# Patient Record
Sex: Female | Born: 1995
Health system: Southern US, Community
[De-identification: ages and names within clinical notes are randomized; demographics above are authoritative.]

## PROBLEM LIST (undated history)

## (undated) DIAGNOSIS — J302 Other seasonal allergic rhinitis: Secondary | ICD-10-CM

---
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
## 2015-01-24 ENCOUNTER — Ambulatory Visit (INDEPENDENT_AMBULATORY_CARE_PROVIDER_SITE_OTHER): Payer: 59

## 2015-01-24 DIAGNOSIS — Z23 Encounter for immunization: Secondary | ICD-10-CM | POA: Diagnosis not present

## 2015-04-14 MED FILL — HYDROCODON-APAP 5-325: 5-325 | 5 days supply | Qty: 20 | Fill #0

## 2015-04-14 MED FILL — IBUPROFEN 600 MG TABLET: 600 | 8 days supply | Qty: 30 | Fill #0

## 2015-04-14 MED FILL — PENICILLIN VK 500 MG TABLET: 500 | 10 days supply | Qty: 40 | Fill #0

## 2015-04-14 MED FILL — CHLORHEXIDINE 0.12% RINSE: 0.12 | 16 days supply | Qty: 473 | Fill #0

## 2015-04-21 MED FILL — CHLORHEXIDINE 0.12% RINSE: 0.12 | 17 days supply | Qty: 473 | Fill #0

## 2015-07-06 ENCOUNTER — Ambulatory Visit (INDEPENDENT_AMBULATORY_CARE_PROVIDER_SITE_OTHER): Payer: 59 | Admitting: Family Medicine

## 2015-07-06 VITALS — BP 122/60 | HR 107 | Temp 97.8°F | Resp 16 | Ht 63.0 in | Wt 169.9 lb

## 2015-07-06 DIAGNOSIS — H6691 Otitis media, unspecified, right ear: Secondary | ICD-10-CM | POA: Diagnosis not present

## 2015-07-06 DIAGNOSIS — H7291 Unspecified perforation of tympanic membrane, right ear: Secondary | ICD-10-CM | POA: Diagnosis not present

## 2015-07-06 MED ORDER — AMOXICILLIN 875 MG PO TABS
875.0000 mg | ORAL_TABLET | Freq: Two times a day (BID) | ORAL | Status: DC
Start: 2015-07-06 — End: 2018-02-03

## 2015-07-06 MED ORDER — OFLOXACIN 0.3 % OT SOLN: 5.0000 [drp] | Freq: Every day | OTIC | Status: DC

## 2015-07-06 NOTE — Progress Notes (Signed)
Patient ID: Frances Ayala, female    DOB: [birth year]  Age: 20 y.o. MRN: [id number]  Chief Complaint  Patient presents with  . Otalgia    right ear; states she had some Greth discharge  . other    states she hears a squeaky noise  . Hearing Problem    hard to hear on the right side    Subjective:   20 year old lady with drainage from her right ear over the last week. She's not really having a lot of pain. She did not have any fever or cold. She has not had troubles with that ear in the past. She has not been able to hear well. She gets a popping sound in the right ear at times. Otherwise he is a healthy-appearing lady. Works at [employer], planning to go back to school. Pregnant.  Current allergies, medications, problem list, past/family and social histories reviewed.  Objective:  BP 122/60 mmHg  Pulse 107  Temp(Src) 97.8 F (36.6 C) (Oral)  Resp 16  Ht 5\' 3"  (1.6 m)  Wt 169 lb 14.4 oz (77.066 kg)  BMI 30.10 kg/m2  SpO2 99%  LMP 06/27/2015  No acute distress. Both ear canals have a moderate amount of wax. Can see a portion of the left TM fine. The right TM has what appears to be a perforation, though it is not very well visualized. I am reluctant to have it irrigated due to this.  Assessment & Plan:   Assessment: 1. Acute right otitis media, recurrence not specified, unspecified otitis media type   2. Tympanic membrane perforation, right       Plan: Probable perforation of right TM. I think she needs to see an ENT doctor and get the debris and wax suctioned out. It is draining a purulent looking mucus.  Orders Placed This Encounter  Procedures  . Ambulatory referral to ENT    Referral Priority:  Routine    Referral Type:  Consultation    Referral Reason:  Specialty Services Required    Referred to Provider:  Christia Readingwight Bates, MD    Requested Specialty:  Otolaryngology    Number of Visits Requested:  1    Meds ordered this encounter  Medications  . adapalene  (DIFFERIN) 0.1 % gel    Sig: Apply topically at bedtime.  . Melatonin 3 MG TABS    Sig: Take by mouth.  Marland Kitchen. ofloxacin (FLOXIN OTIC) 0.3 % otic solution    Sig: Place 5 drops into the right ear daily.    Dispense:  5 mL    Refill:  0  . amoxicillin (AMOXIL) 875 MG tablet    Sig: Take 1 tablet (875 mg total) by mouth 2 (two) times daily.    Dispense:  20 tablet    Refill:  0         Patient Instructions  Use the ofloxacin eardrops 5 drops in right ear daily at bedtime  Take the amoxicillin 875 mg one twice daily  In the event of acute ear pain return at anytime  Referral is being made to ENT for evaluation  Try to avoid getting a lot of water in the ear until it can be suctioned out.     Return if symptoms worsen or fail to improve.   Colton Engdahl, MD 07/06/2015

## 2015-07-06 NOTE — Patient Instructions (Signed)
Use the ofloxacin eardrops 5 drops in right ear daily at bedtime  Take the amoxicillin 875 mg one twice daily  In the event of acute ear pain return at anytime  Referral is being made to ENT for evaluation  Try to avoid getting a lot of water in the ear until it can be suctioned out.

## 2015-07-11 DIAGNOSIS — H6503 Acute serous otitis media, bilateral: Secondary | ICD-10-CM | POA: Diagnosis not present

## 2015-07-11 DIAGNOSIS — H6123 Impacted cerumen, bilateral: Secondary | ICD-10-CM | POA: Diagnosis not present

## 2015-12-12 ENCOUNTER — Other Ambulatory Visit: Payer: Self-pay | Admitting: Family Medicine

## 2015-12-12 ENCOUNTER — Ambulatory Visit
Admission: RE | Admit: 2015-12-12 | Discharge: 2015-12-12 | Disposition: A | Discharge status: Home / Self Care | Payer: BLUE CROSS/BLUE SHIELD | Source: Ambulatory Visit | Attending: Family Medicine | Admitting: Family Medicine

## 2015-12-12 DIAGNOSIS — T148XXA Other injury of unspecified body region, initial encounter: Secondary | ICD-10-CM

## 2018-02-03 ENCOUNTER — Ambulatory Visit (HOSPITAL_COMMUNITY)
Admission: EM | Admit: 2018-02-03 | Discharge: 2018-02-03 | Disposition: A | Discharge status: Home / Self Care | Payer: BLUE CROSS/BLUE SHIELD | Attending: Family Medicine | Admitting: Family Medicine

## 2018-02-03 ENCOUNTER — Encounter (HOSPITAL_COMMUNITY): Payer: Self-pay | Admitting: Emergency Medicine

## 2018-02-03 DIAGNOSIS — J029 Acute pharyngitis, unspecified: Secondary | ICD-10-CM

## 2018-02-03 LAB — POCT RAPID STREP A: STREPTOCOCCUS, GROUP A SCREEN (DIRECT): NEGATIVE

## 2018-02-03 MED ORDER — CHLORHEXIDINE GLUCONATE 0.12 % MT SOLN: 15.0000 mL | Freq: Two times a day (BID) | OROMUCOSAL | 0 refills | Status: DC

## 2018-02-03 MED FILL — CHLORHEXIDINE 0.12% RINSE: 0.12 | 16 days supply | Qty: 473 | Fill #0

## 2018-02-03 NOTE — Discharge Instructions (Addendum)
Rapid strep test is normal.  When this occurs, we run a secondary strep test and we will notify you if there is any reason to change your medication.

## 2018-02-03 NOTE — ED Triage Notes (Signed)
Pt here for sore throat x 4 days 

## 2018-02-03 NOTE — ED Provider Notes (Signed)
[facility]    CSN: [id number] Arrival date & time: 02/03/18  1241     History   Chief Complaint Chief Complaint  Patient presents with  . Sore Throat    HPI Frances Ayala is a 22 y.o. female.   New patient to [facility] urgent care    patient complains of sore throat for the last 4 days.  No fever.  Patient works to part-time jobs, [employer] and [employer].     Past Medical History:  Diagnosis Date  . Seasonal allergies     Patient Active Problem List   Diagnosis Date Noted  . Weight loss 07/17/2010    History reviewed. No pertinent surgical history.  OB History   None      Home Medications    Prior to Admission medications   Medication Sig Start Date End Date Taking? Authorizing Provider  adapalene (DIFFERIN) 0.1 % gel Apply topically at bedtime.    [provider]  chlorhexidine (PERIDEX) 0.12 % solution Use as directed 15 mLs in the mouth or throat 2 (two) times daily. 02/03/18   Elvina Sidle, MD  Melatonin 3 MG TABS Take by mouth.    [provider]    Family History History reviewed. No pertinent family history.  Social History Social History   Tobacco Use  . Smoking status: Never Smoker  Substance Use Topics  . Alcohol use: No  . Drug use: No     Allergies   Patient has no known allergies.   Review of Systems Review of Systems  HENT: Positive for sore throat.   All other systems reviewed and are negative.    Physical Exam Triage Vital Signs ED Triage Vitals  Enc Vitals Group     BP 02/03/18 1319 117/65     Pulse Rate 02/03/18 1319 98     Resp 02/03/18 1319 18     Temp 02/03/18 1319 98.2 F (36.8 C)     Temp Source 02/03/18 1319 Oral     SpO2 02/03/18 1319 100 %     Weight --      Height --      Head Circumference --      Peak Flow --      Pain Score 02/03/18 1320 4     Pain Loc --      Pain Edu? --      Excl. in GC? --    No data found.  Updated Vital Signs BP 117/65 (BP  Location: Left Arm)   Pulse 98   Temp 98.2 F (36.8 C) (Oral)   Resp 18   SpO2 100%    Physical Exam  Constitutional: She is oriented to person, place, and time. She appears well-developed and well-nourished.  HENT:  Head: Normocephalic and atraumatic.  Right Ear: Hearing, tympanic membrane and ear canal normal.  Left Ear: Hearing, tympanic membrane and ear canal normal.  Mouth/Throat: Uvula is midline. Oropharyngeal exudate and posterior oropharyngeal erythema present.  Neck: Normal range of motion. Neck supple.  Cardiovascular: Normal rate and normal heart sounds.  No murmur heard. Pulmonary/Chest: Effort normal and breath sounds normal.  Neurological: She is alert and oriented to person, place, and time.  Skin: Skin is warm and dry.  Psychiatric: She has a normal mood and affect. Her behavior is normal.  Nursing note and vitals reviewed.    UC Treatments / Results  Labs (all labs ordered are listed, but only abnormal results are displayed) Labs Reviewed  CULTURE, GROUP A STREP [facility])    EKG None  Radiology No results found.  Procedures Procedures (including critical care time)  Medications Ordered in UC Medications - No data to display  Initial Impression / Assessment and Plan / UC Course  I have reviewed the triage vital signs and the nursing notes.  Pertinent labs & imaging results that were available during my care of the patient were reviewed by me and considered in my medical decision making (see chart for details).    Final Clinical Impressions(s) / UC Diagnoses   Final diagnoses:  Pharyngitis, unspecified etiology     Discharge Instructions     Rapid strep test is normal.  When this occurs, we run a secondary strep test and we will notify you if there is any reason to change your medication.    ED Prescriptions    Medication Sig Dispense Auth. Provider   chlorhexidine (PERIDEX) 0.12 % solution Use as directed 15 mLs in the mouth or throat  2 (two) times daily. 120 mL Elvina Sidle, MD     Controlled Substance Prescriptions [place] Controlled Substance Registry consulted? No   Elvina Sidle, MD 02/03/18 [phone]

## 2018-02-05 LAB — CULTURE, GROUP A STREP (THRC)

## 2018-02-25 ENCOUNTER — Encounter (HOSPITAL_COMMUNITY): Payer: Self-pay

## 2018-02-25 ENCOUNTER — Ambulatory Visit (HOSPITAL_COMMUNITY)
Admission: EM | Admit: 2018-02-25 | Discharge: 2018-02-25 | Disposition: A | Discharge status: Home / Self Care | Payer: BLUE CROSS/BLUE SHIELD | Attending: Family Medicine | Admitting: Family Medicine

## 2018-02-25 DIAGNOSIS — B349 Viral infection, unspecified: Secondary | ICD-10-CM

## 2018-02-25 MED ORDER — IPRATROPIUM-ALBUTEROL 0.5-2.5 (3) MG/3ML IN SOLN
RESPIRATORY_TRACT | Status: AC
  Filled 2018-02-25: qty 3

## 2018-02-25 MED ORDER — LIDOCAINE VISCOUS HCL 2 % MT SOLN: OROMUCOSAL | 0 refills | Status: DC

## 2018-02-25 MED ORDER — BENZONATATE 100 MG PO CAPS: 100.0000 mg | ORAL_CAPSULE | Freq: Three times a day (TID) | ORAL | 0 refills | Status: DC

## 2018-02-25 MED ORDER — IPRATROPIUM BROMIDE 0.06 % NA SOLN: 2.0000 | Freq: Four times a day (QID) | NASAL | 0 refills | Status: DC

## 2018-02-25 NOTE — ED Triage Notes (Signed)
Pt here for flu like symptoms that started on Sunday. Pt is having throat pain, body aches, and runny nose

## 2018-02-25 NOTE — Discharge Instructions (Signed)
No alarming signs on exam. Tessalon for cough. Start lidocaine for sore throat, do not eat or drink for the next 40 mins after use as it can stunt your gag reflex. Start atrovent nasal spray for nasal congestion/drainage. You can continue your cold medicines to help with symptoms as well. You can use over the counter nasal saline rinse such as neti pot for nasal congestion. Keep hydrated, your urine should be clear to pale yellow in color. Tylenol/motrin for fever and pain. Monitor for any worsening of symptoms, chest pain, shortness of breath, wheezing, swelling of the throat, follow up for reevaluation.   For sore throat/cough try using a honey-based tea. Use 3 teaspoons of honey with juice squeezed from half lemon. Place shaved pieces of ginger into 1/2-1 cup of water and warm over stove top. Then mix the ingredients and repeat every 4 hours as needed.

## 2018-02-25 NOTE — ED Provider Notes (Signed)
[facility]    CSN: [id number] Arrival date & time: 02/25/18  1925     History   Chief Complaint Chief Complaint  Patient presents with  . Generalized Body Aches    HPI TENNILLE Ayala is a 22 y.o. female.   22 year old female comes in for 3-4 day history of URI symptoms. Has had body aches, sore throat, rhinorrhea, nasal congestion, cough. Denies fever, chills, night sweats. Denies abdominal pain, nausea, vomiting. Still eating and drinking without difficulty. Never smoker. otc cold medicine with temporary relief.      Past Medical History:  Diagnosis Date  . Seasonal allergies     Patient Active Problem List   Diagnosis Date Noted  . Weight loss 07/17/2010    History reviewed. No pertinent surgical history.  OB History   None      Home Medications    Prior to Admission medications   Medication Sig Start Date End Date Taking? Authorizing Provider  adapalene (DIFFERIN) 0.1 % gel Apply topically at bedtime.    [provider]  benzonatate (TESSALON) 100 MG capsule Take 1 capsule (100 mg total) by mouth every 8 (eight) hours. 02/25/18   Cathie Hoops, Amy V, PA-C  ipratropium (ATROVENT) 0.06 % nasal spray Place 2 sprays into both nostrils 4 (four) times daily. 02/25/18   Cathie Hoops, Amy V, PA-C  lidocaine (XYLOCAINE) 2 % solution 5-15 mL gurgle as needed 02/25/18   Cathie Hoops, Amy V, PA-C  Melatonin 3 MG TABS Take by mouth.    [provider]    Family History History reviewed. No pertinent family history.  Social History Social History   Tobacco Use  . Smoking status: Never Smoker  . Smokeless tobacco: Never Used  Substance Use Topics  . Alcohol use: No  . Drug use: No     Allergies   Patient has no known allergies.   Review of Systems Review of Systems  Reason unable to perform ROS: See HPI as above.     Physical Exam Triage Vital Signs ED Triage Vitals  Enc Vitals Group     BP 02/25/18 1945 106/77     Pulse Rate 02/25/18 1945 99    Resp --      Temp 02/25/18 1945 98.8 F (37.1 C)     Temp Source 02/25/18 1945 Oral     SpO2 02/25/18 1945 100 %     Weight --      Height --      Head Circumference --      Peak Flow --      Pain Score 02/25/18 1948 0     Pain Loc --      Pain Edu? --      Excl. in GC? --    No data found.  Updated Vital Signs BP 106/77   Pulse 99   Temp 98.8 F (37.1 C) (Oral)   LMP 02/04/2018   SpO2 100%   Visual Acuity Right Eye Distance:   Left Eye Distance:   Bilateral Distance:    Right Eye Near:   Left Eye Near:    Bilateral Near:     Physical Exam  Constitutional: She is oriented to person, place, and time. She appears well-developed and well-nourished. No distress.  HENT:  Head: Normocephalic and atraumatic.  Right Ear: Tympanic membrane, external ear and ear canal normal. Tympanic membrane is not erythematous and not bulging.  Left Ear: Tympanic membrane, external ear and ear canal normal. Tympanic membrane is  not erythematous and not bulging.  Nose: Rhinorrhea present. Right sinus exhibits no maxillary sinus tenderness and no frontal sinus tenderness. Left sinus exhibits no maxillary sinus tenderness and no frontal sinus tenderness.  Mouth/Throat: Uvula is midline, oropharynx is clear and moist and mucous membranes are normal. No tonsillar exudate.  Eyes: Pupils are equal, round, and reactive to light. Conjunctivae are normal.  Neck: Normal range of motion. Neck supple.  Cardiovascular: Normal rate, regular rhythm and normal heart sounds. Exam reveals no gallop and no friction rub.  No murmur heard. Pulmonary/Chest: Effort normal and breath sounds normal. She has no decreased breath sounds. She has no wheezes. She has no rhonchi. She has no rales.  Lymphadenopathy:    She has no cervical adenopathy.  Neurological: She is alert and oriented to person, place, and time.  Skin: Skin is warm and dry.  Psychiatric: She has a normal mood and affect. Her behavior is normal.  Judgment normal.     UC Treatments / Results  Labs (all labs ordered are listed, but only abnormal results are displayed) Labs Reviewed - No data to display  EKG None  Radiology No results found.  Procedures Procedures (including critical care time)  Medications Ordered in UC Medications - No data to display  Initial Impression / Assessment and Plan / UC Course  I have reviewed the triage vital signs and the nursing notes.  Pertinent labs & imaging results that were available during my care of the patient were reviewed by me and considered in my medical decision making (see chart for details).    Discussed with patient history and exam most consistent with viral URI. Symptomatic treatment as needed. Push fluids. Return precautions given.   Final Clinical Impressions(s) / UC Diagnoses   Final diagnoses:  Viral illness    ED Prescriptions    Medication Sig Dispense Auth. Provider   benzonatate (TESSALON) 100 MG capsule Take 1 capsule (100 mg total) by mouth every 8 (eight) hours. 21 capsule Yu, Amy V, PA-C   ipratropium (ATROVENT) 0.06 % nasal spray Place 2 sprays into both nostrils 4 (four) times daily. 15 mL Yu, Amy V, PA-C   lidocaine (XYLOCAINE) 2 % solution 5-15 mL gurgle as needed 150 mL Threasa AlphaYu, Amy V, PA-C        Yu, Amy V, [place] 02/25/18 2006

## 2018-06-04 DIAGNOSIS — Z13220 Encounter for screening for lipoid disorders: Secondary | ICD-10-CM | POA: Diagnosis not present

## 2018-06-04 DIAGNOSIS — Z131 Encounter for screening for diabetes mellitus: Secondary | ICD-10-CM | POA: Diagnosis not present

## 2018-06-04 DIAGNOSIS — Z23 Encounter for immunization: Secondary | ICD-10-CM | POA: Diagnosis not present

## 2018-06-04 DIAGNOSIS — Z0000 Encounter for general adult medical examination without abnormal findings: Secondary | ICD-10-CM | POA: Diagnosis not present

## 2018-07-02 DIAGNOSIS — Z23 Encounter for immunization: Secondary | ICD-10-CM | POA: Diagnosis not present

## 2019-02-01 ENCOUNTER — Other Ambulatory Visit: Payer: Self-pay

## 2019-02-01 DIAGNOSIS — Z20822 Contact with and (suspected) exposure to covid-19: Secondary | ICD-10-CM

## 2019-02-02 LAB — NOVEL CORONAVIRUS, NAA: SARS-CoV-2, NAA: NOT DETECTED

## 2019-02-17 ENCOUNTER — Other Ambulatory Visit: Payer: Self-pay

## 2019-02-17 ENCOUNTER — Emergency Department (HOSPITAL_BASED_OUTPATIENT_CLINIC_OR_DEPARTMENT_OTHER): Payer: 59

## 2019-02-17 ENCOUNTER — Encounter (HOSPITAL_BASED_OUTPATIENT_CLINIC_OR_DEPARTMENT_OTHER): Payer: Self-pay

## 2019-02-17 ENCOUNTER — Emergency Department (HOSPITAL_BASED_OUTPATIENT_CLINIC_OR_DEPARTMENT_OTHER)
Admission: EM | Admit: 2019-02-17 | Discharge: 2019-02-17 | Disposition: A | Discharge status: Home / Self Care | Payer: 59 | Attending: Emergency Medicine | Admitting: Emergency Medicine

## 2019-02-17 DIAGNOSIS — Z20828 Contact with and (suspected) exposure to other viral communicable diseases: Secondary | ICD-10-CM | POA: Diagnosis not present

## 2019-02-17 DIAGNOSIS — R0602 Shortness of breath: Secondary | ICD-10-CM

## 2019-02-17 DIAGNOSIS — R000 Tachycardia, unspecified: Secondary | ICD-10-CM | POA: Insufficient documentation

## 2019-02-17 LAB — COMPREHENSIVE METABOLIC PANEL
ALT: 24 U/L (ref 0–44)
AST: 21 U/L (ref 15–41)
Albumin: 3.9 g/dL (ref 3.5–5.0)
Alkaline Phosphatase: 78 U/L (ref 38–126)
Anion gap: 7 (ref 5–15)
BUN: 22 mg/dL — ABNORMAL HIGH (ref 6–20)
CO2: 21 mmol/L — ABNORMAL LOW (ref 22–32)
Calcium: 9.1 mg/dL (ref 8.9–10.3)
Chloride: 108 mmol/L (ref 98–111)
Creatinine, Ser: 0.74 mg/dL (ref 0.44–1.00)
GFR calc Af Amer: >60 mL/min (ref >60)
GFR calc non Af Amer: >60 mL/min (ref >60)
Glucose, Bld: 98 mg/dL (ref 70–99)
Potassium: 3.9 mmol/L (ref 3.5–5.1)
Sodium: 136 mmol/L (ref 135–145)
Total Bilirubin: 0.6 mg/dL (ref 0.3–1.2)
Total Protein: 7.2 g/dL (ref 6.5–8.1)

## 2019-02-17 LAB — CBC
HCT: 38.7 % (ref 36.0–46.0)
Hemoglobin: 12.4 g/dL (ref 12.0–15.0)
MCH: 28.6 pg (ref 26.0–34.0)
MCHC: 32 g/dL (ref 30.0–36.0)
MCV: 89.4 fL (ref 80.0–100.0)
Platelets: 286 10*3/uL (ref 150–400)
RBC: 4.33 MIL/uL (ref 3.87–5.11)
RDW: 12.2 % (ref 11.5–15.5)
WBC: 4.4 10*3/uL (ref 4.0–10.5)
nRBC: 0 % (ref 0.0–0.2)

## 2019-02-17 LAB — HCG, SERUM, QUALITATIVE: Preg, Serum: NEGATIVE

## 2019-02-17 LAB — D-DIMER, QUANTITATIVE (NOT AT ARMC): D-Dimer, Quant: 0.66 ug/mL-FEU — ABNORMAL HIGH (ref 0.00–0.50)

## 2019-02-17 MED ORDER — IOHEXOL 350 MG/ML SOLN
80.0000 mL | Freq: Once | INTRAVENOUS | Status: AC | PRN
  Administered 2019-02-17: 80 mL via INTRAVENOUS

## 2019-02-17 MED ORDER — ALBUTEROL SULFATE HFA 108 (90 BASE) MCG/ACT IN AERS
2.0000 | INHALATION_SPRAY | Freq: Once | RESPIRATORY_TRACT | Status: AC
  Administered 2019-02-17: 2 via RESPIRATORY_TRACT
  Filled 2019-02-17: qty 6.7

## 2019-02-17 NOTE — Discharge Instructions (Signed)
You were evaluated in the Emergency Department and after careful evaluation, we did not find any emergent condition requiring admission or further testing in the hospital.  Your exam/testing today is overall reassuring.  Your CT scan did not show any blood clots.  Your symptoms could be due to a virus, possibly coronavirus.  We have tested you for the coronavirus here in the Emergency Department.  Please isolate or quarantine at home until you receive a negative test result.  If positive, we recommend continued home quarantine per [facility] recommendations.   Please return to the Emergency Department if you experience any worsening of your condition.  We encourage you to follow up with a primary care provider.  Thank you for allowing [place] to be a part of your care.

## 2019-02-17 NOTE — ED Triage Notes (Signed)
Pt reports SOB starting today denies fever, cough, or loss of taste or smell.

## 2019-02-17 NOTE — ED Provider Notes (Signed)
[facility] Emergency Department Provider Note MRN:  [id number]  Arrival date & time: 02/17/19     Chief Complaint   Shortness of Breath   History of Present Illness   Frances Ayala is a 23 y.o. year-old female with no pertinent past medical history presenting to the ED with chief complaint of shortness of breath.  Sudden onset shortness of breath as she was going into work this morning.  Initially mild but became more severe.  Denies chest pain, no fever, no cough, no nasal congestion, no cold-like symptoms, no leg pain or swelling, denies birth control pills, no recent travel.  Otherwise healthy, no drugs or alcohol.  Review of Systems  A complete 10 system review of systems was obtained and all systems are negative except as noted in the HPI and PMH.   Patient's Health History    Past Medical History:  Diagnosis Date  . Seasonal allergies     History reviewed. No pertinent surgical history.  History reviewed. No pertinent family history.  Social History   Socioeconomic History  . Marital status: Single    Spouse name: Not on file  . Number of children: Not on file  . Years of education: Not on file  . Highest education level: Not on file  Occupational History  . Not on file  Social Needs  . Financial resource strain: Not on file  . Food insecurity    Worry: Not on file    Inability: Not on file  . Transportation needs    Medical: Not on file    Non-medical: Not on file  Tobacco Use  . Smoking status: Never Smoker  . Smokeless tobacco: Never Used  Substance and Sexual Activity  . Alcohol use: No  . Drug use: No  . Sexual activity: Not on file  Lifestyle  . Physical activity    Days per week: Not on file    Minutes per session: Not on file  . Stress: Not on file  Relationships  . Social Musician on phone: Not on file    Gets together: Not on file    Attends religious service: Not on file    Active member of club  or organization: Not on file    Attends meetings of clubs or organizations: Not on file    Relationship status: Not on file  . Intimate partner violence    Fear of current or ex partner: Not on file    Emotionally abused: Not on file    Physically abused: Not on file    Forced sexual activity: Not on file  Other Topics Concern  . Not on file  Social History Narrative  . Not on file     Physical Exam  Vital Signs and Nursing Notes reviewed Vitals:   02/17/19 0918 02/17/19 1000  BP: 116/86 119/63  Pulse: 98 94  Resp: 19 (!) 23  Temp:    SpO2: 100% 100%    CONSTITUTIONAL: Well-appearing, NAD NEURO:  Alert and oriented x 3, no focal deficits EYES:  eyes equal and reactive ENT/NECK:  no LAD, no JVD CARDIO: Tachycardic rate, well-perfused, normal S1 and S2 PULM:  CTAB no wheezing or rhonchi GI/GU:  normal bowel sounds, non-distended, non-tender MSK/SPINE:  No gross deformities, no edema SKIN:  no rash, atraumatic PSYCH:  Appropriate speech and behavior  Diagnostic and Interventional Summary    EKG Interpretation  Date/Time:  Wednesday February 17 2019 08:32:35 EST Ventricular Rate:  106 PR Interval:    QRS Duration: 69 QT Interval:  308 QTC Calculation: 409 R Axis:   55 Text Interpretation: Sinus tachycardia Nonspecific T abnormalities, anterior leads Baseline wander in lead(s) I S1Q3T3 Confirmed by Gerlene Fee [phone]) on 02/17/2019 9:02:47 AM      Labs Reviewed  COMPREHENSIVE METABOLIC PANEL - Abnormal; Notable for the following components:      Result Value   CO2 21 (*)    BUN 22 (*)    All other components within normal limits  D-DIMER, QUANTITATIVE (NOT AT [facility]) - Abnormal; Notable for the following components:   D-Dimer, Quant 0.66 (*)    All other components within normal limits  NOVEL CORONAVIRUS, NAA (HOSP ORDER, SEND-OUT TO REF LAB; TAT 18-24 HRS)  CBC  HCG, SERUM, QUALITATIVE    CTA Chest for PE  Final Result    XR Chest Single View  Final  Result      Medications  albuterol (VENTOLIN HFA) 108 (90 Base) MCG/ACT inhaler 2 puff (2 puffs Inhalation Given by Other 02/17/19 0842)  iohexol (OMNIPAQUE) 350 MG/ML injection 80 mL (80 mLs Intravenous Contrast Given 02/17/19 1028)     Procedures  /  Critical Care Procedures  ED Course and Medical Decision Making  I have reviewed the triage vital signs and the nursing notes.  Pertinent labs & imaging results that were available during my care of the patient were reviewed by me and considered in my medical decision making (see below for details).     Sudden onset shortness of breath, arrives with tachycardia otherwise normal vital signs, clear lungs, no signs of DVT but there is concern for pulmonary embolism, no infectious symptoms.  Screening with D-dimer.  EKG shows S1Q3T3 change from prior.  D-dimer positive, CTA negative.  Patient is feeling better after breathing treatment.  She has no history of asthma.  She has no cold-like symptoms, but it is possible that a virus has caused some reactive airway symptoms, which would explain her improvement with albuterol inhaler.  Will swab her for coronavirus, appropriate for discharge.  Barth Kirks. Sedonia Small, [place] [email or web address]  Final Clinical Impressions(s) / ED Diagnoses     ICD-10-CM   1. SOB (shortness of breath)  R06.02 XR Chest Single View    XR Chest Single View    ED Discharge Orders    None       Discharge Instructions Discussed with and Provided to Patient:     Discharge Instructions     You were evaluated in the Emergency Department and after careful evaluation, we did not find any emergent condition requiring admission or further testing in the hospital.  Your exam/testing today is overall reassuring.  Your CT scan did not show any blood clots.  Your symptoms could be due to a virus, possibly coronavirus.  We have tested you for the coronavirus here in the  Emergency Department.  Please isolate or quarantine at home until you receive a negative test result.  If positive, we recommend continued home quarantine per [facility] recommendations.   Please return to the Emergency Department if you experience any worsening of your condition.  We encourage you to follow up with a primary care provider.  Thank you for allowing [place] to be a part of your care.       Maudie Flakes, MD 02/17/19 1141

## 2019-02-17 NOTE — ED Notes (Signed)
XR at bedside

## 2019-02-17 NOTE — ED Notes (Signed)
ED Provider at bedside. 

## 2019-02-18 LAB — NOVEL CORONAVIRUS, NAA (HOSP ORDER, SEND-OUT TO REF LAB; TAT 18-24 HRS): SARS-CoV-2, NAA: NOT DETECTED

## 2019-06-02 ENCOUNTER — Other Ambulatory Visit: Payer: Self-pay

## 2019-06-02 ENCOUNTER — Encounter (HOSPITAL_COMMUNITY): Payer: Self-pay | Admitting: Emergency Medicine

## 2019-06-02 ENCOUNTER — Ambulatory Visit (HOSPITAL_COMMUNITY)
Admission: EM | Admit: 2019-06-02 | Discharge: 2019-06-02 | Disposition: A | Discharge status: Home / Self Care | Payer: 59 | Attending: Family Medicine | Admitting: Family Medicine

## 2019-06-02 DIAGNOSIS — Z20822 Contact with and (suspected) exposure to covid-19: Secondary | ICD-10-CM | POA: Insufficient documentation

## 2019-06-02 DIAGNOSIS — B2790 Infectious mononucleosis, unspecified without complication: Secondary | ICD-10-CM

## 2019-06-02 DIAGNOSIS — J029 Acute pharyngitis, unspecified: Secondary | ICD-10-CM | POA: Insufficient documentation

## 2019-06-02 LAB — POCT INFECTIOUS MONO SCREEN: Mono Screen: POSITIVE — AB

## 2019-06-02 NOTE — Discharge Instructions (Addendum)
You have been tested for COVID-19 today. If your test returns positive, you will receive a phone call from [facility] regarding your results. Negative test results are not called. Both positive and negative results area always visible on MyChart. If you do not have a MyChart account, sign up instructions are provided in your discharge papers. Please do not hesitate to contact us should you have questions or concerns.  You may use over the counter ibuprofen or acetaminophen as needed.  For a sore throat, over the counter products such as Colgate Peroxyl Mouth Sore Rinse or Chloraseptic Sore Throat Spray may provide some temporary relief.   

## 2019-06-02 NOTE — ED Triage Notes (Addendum)
One week ago started having throat pain.  Patient did go to urgent care [address]  Painful swallowing, throat/tonsil swelling.  Strep test, started on antibiotics.  Took antibiotics for 5 days (was instructed to stop antibiotic since test negative) , was told strep negative.    No change in pain, swelling has improved.    Patient called her pcp and was told to add zyrtec and nasal spray yesterday

## 2019-06-02 NOTE — ED Provider Notes (Signed)
[place]   [id number] 06/02/19 Arrival Time: 8182  ASSESSMENT & PLAN:  1. Sore throat     No signs of peritonsillar abscess. Monospot: positive. COVID testing sent.  See AVS for written information on mono.   Discharge Instructions     You have been tested for COVID-19 today. If your test returns positive, you will receive a phone call from [facility] regarding your results. Negative test results are not called. Both positive and negative results area always visible on MyChart. If you do not have a MyChart account, sign up instructions are provided in your discharge papers. Please do not hesitate to contact us should you have questions or concerns.    You may use over the counter ibuprofen or acetaminophen as needed.   For a sore throat, over the counter products such as Colgate Peroxyl Mouth Sore Rinse or Chloraseptic Sore Throat Spray may provide some temporary relief.         Labs Reviewed  NOVEL CORONAVIRUS, NAA (HOSP ORDER, SEND-OUT TO REF LAB; TAT 18-24 HRS)  POCT INFECTIOUS MONO SCREEN      Discharge Instructions     You have been tested for COVID-19 today. If your test returns positive, you will receive a phone call from [facility] regarding your results. Negative test results are not called. Both positive and negative results area always visible on MyChart. If you do not have a MyChart account, sign up instructions are provided in your discharge papers. Please do not hesitate to contact us should you have questions or concerns.    You may use over the counter ibuprofen or acetaminophen as needed.   For a sore throat, over the counter products such as Colgate Peroxyl Mouth Sore Rinse or Chloraseptic Sore Throat Spray may provide some temporary relief.     Reviewed expectations re: course of current medical issues. Questions answered. Outlined signs and symptoms indicating need for more acute intervention. Patient verbalized  understanding. After Visit Summary given.   SUBJECTIVE:  Frances Ayala is a 24 y.o. female who reports a sore throat. Describes as pain with swallowing. Onset abrupt beginning a week ago. Symptoms have gradually improved since beginning; without voice changes. No respiratory symptoms. Normal PO intake but reports discomfort with swallowing. No specific alleviating factors. Fever: absent. No neck pain or swelling. Was seen at PCP; initially placed on antibiotic but told to stop 5 d later; negative throat culture. No associated nausea, vomiting, or abdominal pain. Known sick contacts: none. Recent travel: none. OTC treatment: Zyrtec and nasal spray without much help.   OBJECTIVE:  Vitals:   06/02/19 1627  BP: 119/72  Pulse: (!) 109  Resp: 16  Temp: 98.1 F (36.7 C)  TempSrc: Oral  SpO2: 98%     General appearance: alert; no distress HEENT: throat with mild erythema and with tonsillar hypertrophy; uvula is midline Neck: supple with FROM; small bilateral cervical LAD Lungs: unlabored; speaks full sentences without difficulty Abd: soft Skin: reveals no rash; warm and dry Psychological: alert and cooperative; normal mood and affect  No Known Allergies  Past Medical History:  Diagnosis Date  . Seasonal allergies    Social History   Socioeconomic History  . Marital status: Single    Spouse name: Not on file  . Number of children: Not on file  . Years of education: Not on file  . Highest education level: Not on file  Occupational History  . Not on file  Tobacco Use  . Smoking  status: Never Smoker  . Smokeless tobacco: Never Used  Substance and Sexual Activity  . Alcohol use: Yes  . Drug use: No  . Sexual activity: Not on file  Other Topics Concern  . Not on file  Social History Narrative  . Not on file   Social Determinants of Health   Financial Resource Strain:   . Difficulty of Paying Living Expenses: Not on file  Food Insecurity:   . Worried About Patent examiner in the Last Year: Not on file  . Ran Out of Food in the Last Year: Not on file  Transportation Needs:   . Lack of Transportation (Medical): Not on file  . Lack of Transportation (Non-Medical): Not on file  Physical Activity:   . Days of Exercise per Week: Not on file  . Minutes of Exercise per Session: Not on file  Stress:   . Feeling of Stress : Not on file  Social Connections:   . Frequency of Communication with Friends and Family: Not on file  . Frequency of Social Gatherings with Friends and Family: Not on file  . Attends Religious Services: Not on file  . Active Member of Clubs or Organizations: Not on file  . Attends Banker Meetings: Not on file  . Marital Status: Not on file  Intimate Partner Violence:   . Fear of Current or Ex-Partner: Not on file  . Emotionally Abused: Not on file  . Physically Abused: Not on file  . Sexually Abused: Not on file   History reviewed. No pertinent family history.        Mardella Layman, MD 06/02/19 [phone]

## 2019-06-04 LAB — NOVEL CORONAVIRUS, NAA (HOSP ORDER, SEND-OUT TO REF LAB; TAT 18-24 HRS): SARS-CoV-2, NAA: NOT DETECTED

## 2019-09-23 ENCOUNTER — Other Ambulatory Visit: Payer: Self-pay | Admitting: Internal Medicine

## 2019-09-23 DIAGNOSIS — E288 Other ovarian dysfunction: Secondary | ICD-10-CM

## 2019-10-06 ENCOUNTER — Other Ambulatory Visit: Payer: 59

## 2019-10-07 ENCOUNTER — Ambulatory Visit
Admission: RE | Admit: 2019-10-07 | Discharge: 2019-10-07 | Disposition: A | Discharge status: Home / Self Care | Payer: 59 | Source: Ambulatory Visit | Attending: Internal Medicine | Admitting: Internal Medicine

## 2019-10-07 DIAGNOSIS — E288 Other ovarian dysfunction: Secondary | ICD-10-CM

## 2019-12-20 ENCOUNTER — Encounter (HOSPITAL_BASED_OUTPATIENT_CLINIC_OR_DEPARTMENT_OTHER): Payer: Self-pay | Admitting: *Deleted

## 2019-12-20 ENCOUNTER — Other Ambulatory Visit: Payer: Self-pay

## 2019-12-20 ENCOUNTER — Emergency Department (HOSPITAL_BASED_OUTPATIENT_CLINIC_OR_DEPARTMENT_OTHER)
Admission: EM | Admit: 2019-12-20 | Discharge: 2019-12-20 | Disposition: A | Discharge status: Home / Self Care | Payer: 59 | Attending: Emergency Medicine | Admitting: Emergency Medicine

## 2019-12-20 DIAGNOSIS — Z5321 Procedure and treatment not carried out due to patient leaving prior to being seen by health care provider: Secondary | ICD-10-CM | POA: Diagnosis not present

## 2019-12-20 DIAGNOSIS — R519 Headache, unspecified: Secondary | ICD-10-CM | POA: Insufficient documentation

## 2019-12-20 DIAGNOSIS — M542 Cervicalgia: Secondary | ICD-10-CM | POA: Insufficient documentation

## 2019-12-20 DIAGNOSIS — M549 Dorsalgia, unspecified: Secondary | ICD-10-CM | POA: Insufficient documentation
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
## 2019-12-21 ENCOUNTER — Other Ambulatory Visit: Payer: Self-pay

## 2019-12-21 ENCOUNTER — Ambulatory Visit
Admission: RE | Admit: 2019-12-21 | Discharge: 2019-12-21 | Disposition: A | Discharge status: Home / Self Care | Payer: 59 | Source: Ambulatory Visit

## 2019-12-21 VITALS — BP 136/79 | HR 91 | Temp 98.0°F | Resp 16

## 2019-12-21 DIAGNOSIS — S161XXA Strain of muscle, fascia and tendon at neck level, initial encounter: Secondary | ICD-10-CM

## 2019-12-21 DIAGNOSIS — S39012A Strain of muscle, fascia and tendon of lower back, initial encounter: Secondary | ICD-10-CM | POA: Diagnosis not present

## 2019-12-21 MED ORDER — CYCLOBENZAPRINE HCL 5 MG PO TABS: 5.0000 mg | ORAL_TABLET | Freq: Two times a day (BID) | ORAL | 0 refills | Status: AC | PRN

## 2019-12-21 MED ORDER — NAPROXEN 500 MG PO TABS: 500.0000 mg | ORAL_TABLET | Freq: Two times a day (BID) | ORAL | 0 refills | Status: DC

## 2019-12-21 NOTE — Discharge Instructions (Addendum)
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
## 2020-01-11 ENCOUNTER — Ambulatory Visit (INDEPENDENT_AMBULATORY_CARE_PROVIDER_SITE_OTHER): Payer: 59

## 2020-01-11 ENCOUNTER — Ambulatory Visit (HOSPITAL_COMMUNITY)
Admission: RE | Admit: 2020-01-11 | Discharge: 2020-01-11 | Disposition: A | Discharge status: Home / Self Care | Payer: 59 | Source: Ambulatory Visit | Attending: Emergency Medicine | Admitting: Emergency Medicine

## 2020-01-11 ENCOUNTER — Other Ambulatory Visit: Payer: Self-pay

## 2020-01-11 ENCOUNTER — Encounter (HOSPITAL_COMMUNITY): Payer: Self-pay

## 2020-01-11 VITALS — BP 107/7 | HR 107 | Temp 98.5°F | Resp 18

## 2020-01-11 DIAGNOSIS — R0781 Pleurodynia: Secondary | ICD-10-CM

## 2020-01-11 MED ORDER — IBUPROFEN 800 MG PO TABS: 800.0000 mg | ORAL_TABLET | Freq: Three times a day (TID) | ORAL | 0 refills | Status: AC
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
## 2020-01-11 NOTE — Discharge Instructions (Signed)
Use anti-inflammatories for pain/swelling. You may take up to 800 mg Ibuprofen every 8 hours with food. You may supplement Ibuprofen with Tylenol [phone] mg every 8 hours.  Alternate ice and heat to area Follow up if still not improving or worsening

## 2020-03-03 ENCOUNTER — Other Ambulatory Visit: Payer: Self-pay

## 2020-03-03 ENCOUNTER — Encounter (HOSPITAL_BASED_OUTPATIENT_CLINIC_OR_DEPARTMENT_OTHER): Payer: Self-pay

## 2020-03-03 DIAGNOSIS — M545 Low back pain, unspecified: Secondary | ICD-10-CM | POA: Diagnosis not present

## 2020-03-03 DIAGNOSIS — M549 Dorsalgia, unspecified: Secondary | ICD-10-CM | POA: Diagnosis not present

## 2020-03-03 DIAGNOSIS — M542 Cervicalgia: Secondary | ICD-10-CM | POA: Diagnosis not present

## 2020-03-03 DIAGNOSIS — Y92410 Unspecified street and highway as the place of occurrence of the external cause: Secondary | ICD-10-CM | POA: Insufficient documentation
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
## 2020-03-04 ENCOUNTER — Emergency Department (HOSPITAL_BASED_OUTPATIENT_CLINIC_OR_DEPARTMENT_OTHER)
Admission: EM | Admit: 2020-03-04 | Discharge: 2020-03-04 | Disposition: A | Discharge status: Home / Self Care | Payer: 59 | Attending: Emergency Medicine | Admitting: Emergency Medicine

## 2020-03-04 ENCOUNTER — Emergency Department (HOSPITAL_BASED_OUTPATIENT_CLINIC_OR_DEPARTMENT_OTHER): Payer: 59

## 2020-03-04 DIAGNOSIS — M545 Low back pain, unspecified: Secondary | ICD-10-CM | POA: Diagnosis not present

## 2020-03-04 DIAGNOSIS — Z5321 Procedure and treatment not carried out due to patient leaving prior to being seen by health care provider: Secondary | ICD-10-CM

## 2020-03-04 LAB — PREGNANCY, URINE: Preg Test, Ur: NEGATIVE

## 2020-03-04 MED ORDER — KETOROLAC TROMETHAMINE 15 MG/ML IJ SOLN
30.0000 mg | Freq: Once | INTRAMUSCULAR | Status: AC
  Administered 2020-03-04: 30 mg via INTRAMUSCULAR
  Filled 2020-03-04: qty 2

## 2020-03-04 MED ORDER — OXYCODONE-ACETAMINOPHEN 5-325 MG PO TABS
1.0000 | ORAL_TABLET | Freq: Once | ORAL | Status: AC
  Administered 2020-03-04: 1 via ORAL
  Filled 2020-03-04: qty 1

## 2020-03-04 MED ORDER — SILVER SULFADIAZINE 1 % EX CREA
TOPICAL_CREAM | Freq: Once | CUTANEOUS | Status: AC
  Filled 2020-03-04: qty 85
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
## 2020-03-04 NOTE — ED Notes (Signed)
Pt left prior to having her xrays

## 2020-05-02 ENCOUNTER — Other Ambulatory Visit (HOSPITAL_COMMUNITY): Payer: Self-pay | Admitting: Obstetrics & Gynecology

## 2020-05-02 ENCOUNTER — Other Ambulatory Visit: Payer: Self-pay | Admitting: Obstetrics & Gynecology

## 2020-05-02 DIAGNOSIS — E010 Iodine-deficiency related diffuse (endemic) goiter: Secondary | ICD-10-CM

## 2020-09-05 ENCOUNTER — Encounter (HOSPITAL_COMMUNITY): Payer: Self-pay | Admitting: Emergency Medicine

## 2020-09-05 ENCOUNTER — Other Ambulatory Visit: Payer: Self-pay

## 2020-09-05 ENCOUNTER — Emergency Department (HOSPITAL_COMMUNITY)
Admission: EM | Admit: 2020-09-05 | Discharge: 2020-09-05 | Disposition: A | Discharge status: Home / Self Care | Payer: BC Managed Care – PPO | Attending: Emergency Medicine | Admitting: Emergency Medicine

## 2020-09-05 ENCOUNTER — Emergency Department (HOSPITAL_COMMUNITY): Payer: BC Managed Care – PPO

## 2020-09-05 DIAGNOSIS — Z5321 Procedure and treatment not carried out due to patient leaving prior to being seen by health care provider: Secondary | ICD-10-CM | POA: Diagnosis not present

## 2020-09-05 DIAGNOSIS — R0602 Shortness of breath: Secondary | ICD-10-CM | POA: Diagnosis present

## 2020-09-05 DIAGNOSIS — J029 Acute pharyngitis, unspecified: Secondary | ICD-10-CM | POA: Diagnosis not present

## 2020-09-05 DIAGNOSIS — Z20822 Contact with and (suspected) exposure to covid-19: Secondary | ICD-10-CM | POA: Insufficient documentation

## 2020-09-05 LAB — RESP PANEL BY RT-PCR (FLU A&B, COVID) ARPGX2
Influenza A by PCR: NEGATIVE
Influenza B by PCR: NEGATIVE
SARS Coronavirus 2 by RT PCR: NEGATIVE

## 2020-09-05 NOTE — ED Triage Notes (Signed)
Patient reports SOB and sore throat this evening . No cough or fever.

## 2020-09-05 NOTE — ED Provider Notes (Signed)
Emergency Medicine Provider Triage Evaluation Note  Frances Ayala , a 25 y.o. female  was evaluated in triage.  Pt complains of shortness of breath.  The patient reports that she developed shortness of breath approximately 1 hour prior to arrival when she laid down flat in her bed.  She reports that she felt as if her throat was closing, but denies wheezing, rash, lip or tongue swelling.  She does report that she has been having a sore throat and dysphagia over the last few days.  Several days ago, she had a coughing fit where she coughed so hard that she choked at the end of the coughing fit and felt as if she stopped breathing for a few seconds.  No further episodes of choking or apnea.  Cough has persisted since onset.  No chest pain, leg swelling, abdominal pain, nausea, vomiting, diarrhea, neck pain or stiffness.   Review of Systems  Positive: Shortness of breath, sore throat, cough, choking Negative: Chest pain, fever, chills, nausea, vomiting, diarrhea, neck pain, stiffness, rash, wheezing, lip or tongue swelling  Physical Exam  There were no vitals taken for this visit. Gen:   Awake, no distress   Resp:  Normal effort  MSK:   Moves extremities without difficulty  Other:  Posterior oropharynx is patent.  Tolerating secretions without difficulty.  No sublingual edema.  No rash.  Able to speak in complete, fluent sentences without increased work of breathing.  Medical Decision Making  Medically screening exam initiated at 2:58 AM.  Appropriate orders placed.  Kiera ZYON GROUT was informed that the remainder of the evaluation will be completed by another provider, this initial triage assessment does not replace that evaluation, and the importance of remaining in the ED until their evaluation is complete.  Patient's work-up has been started in the emergency department.   Frederik Pear A, PA-C 09/05/20 6568    Shon Baton, MD 09/05/20 [phone]

## 2020-10-23 IMAGING — US US PELVIS COMPLETE WITH TRANSVAGINAL
1 series · 14 of 25 positions shown · non-contrast
Comparison: None

CLINICAL DATA: Hypergonadotropic hypogonadism syndrome in a female
question ovarian tumor, elevated testosterone, hirsutism, has
Nexplanon; LMP 10/02/2019

EXAM:
TRANSABDOMINAL AND TRANSVAGINAL ULTRASOUND OF PELVIS
TECHNIQUE: Both transabdominal and transvaginal ultrasound examinations of the
pelvis were performed. Transabdominal technique was performed for
global imaging of the pelvis including uterus, ovaries, adnexal
regions, and pelvic cul-de-sac. It was necessary to proceed with
endovaginal exam following the transabdominal exam to visualize the
ovaries and adnexa.

[Series 1: us pelvis complete with transvaginal · 0.26mm/px · 44 acquisitions, 14 frames shown]
[im 1/44]
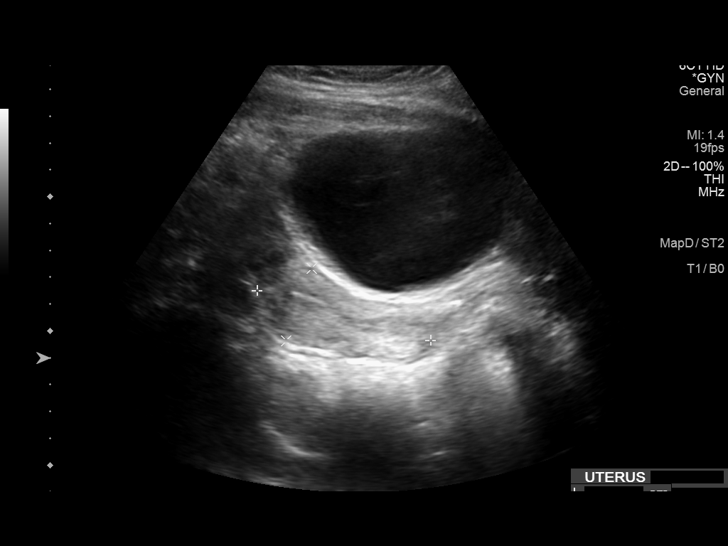
[im 4/44]
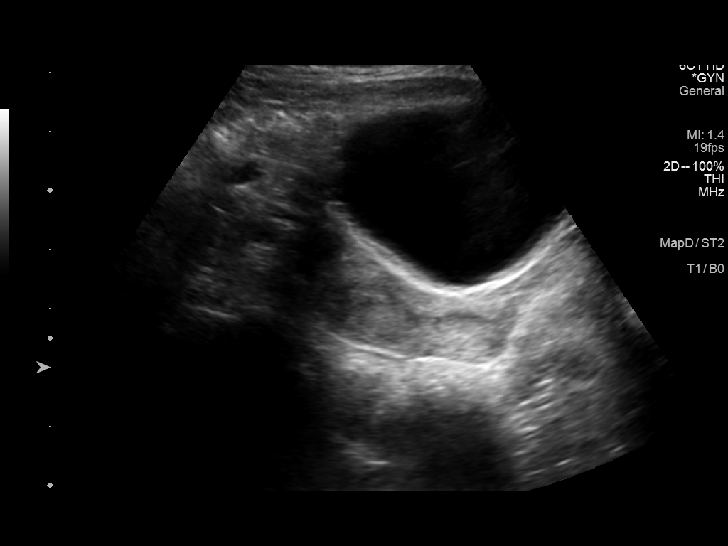
[im 8/44]
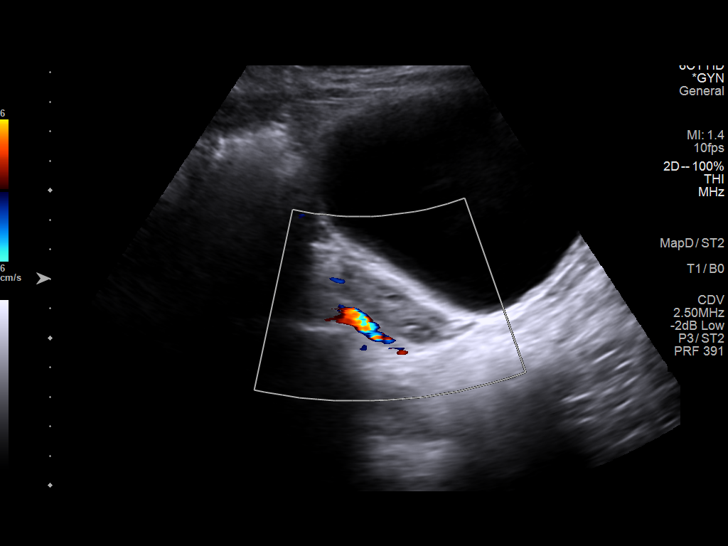
[im 11/44]
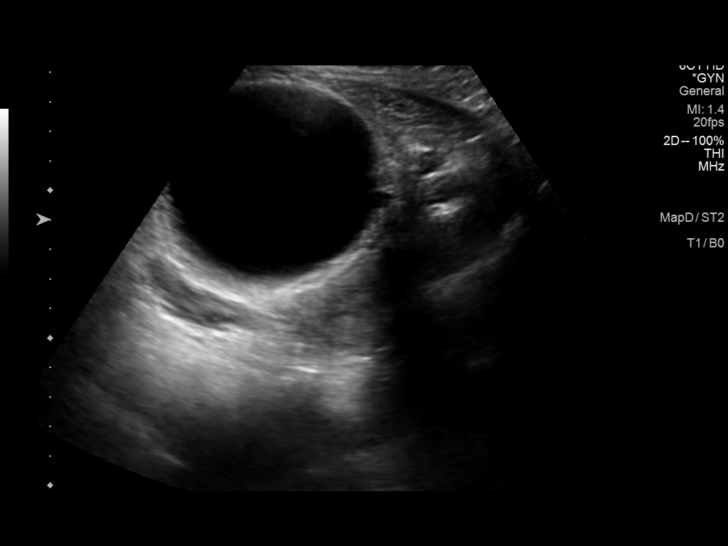
[im 15/44]
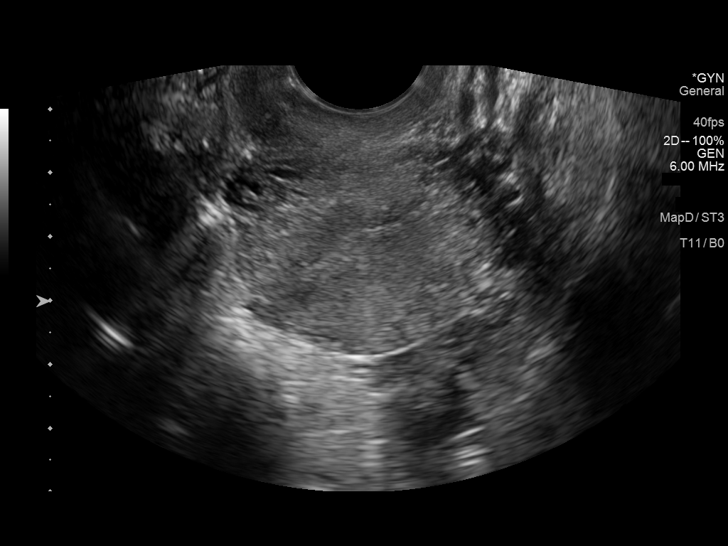
[im 17/44]
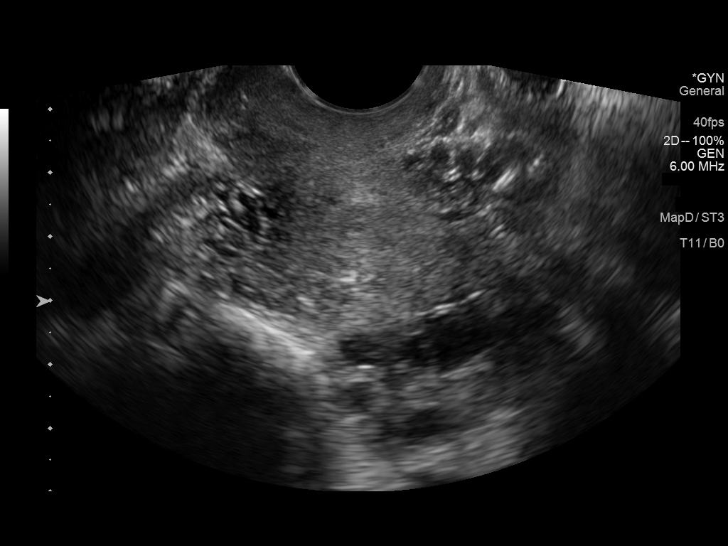
[im 20/44]
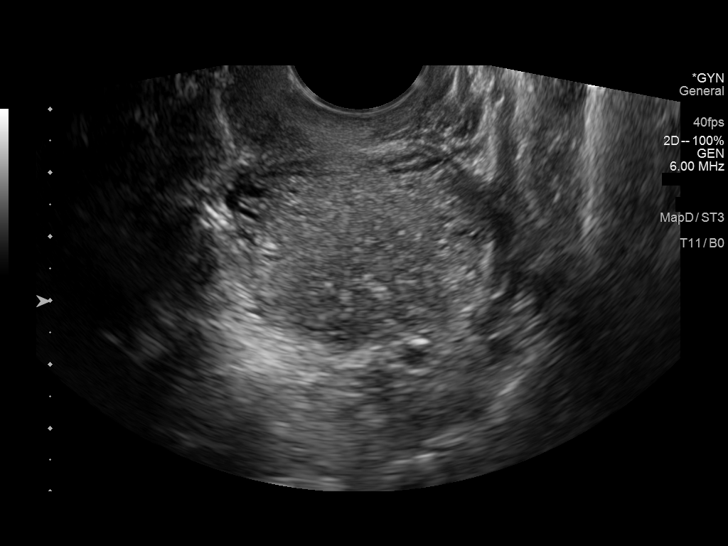
[im 24/44]
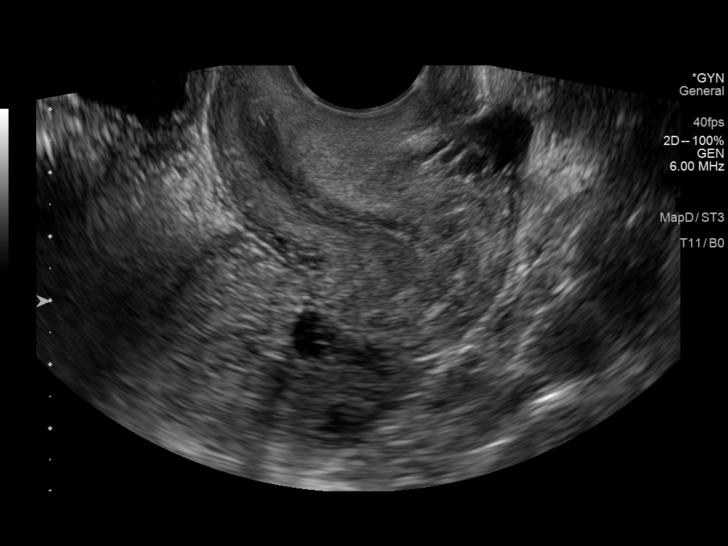
[im 27/44]
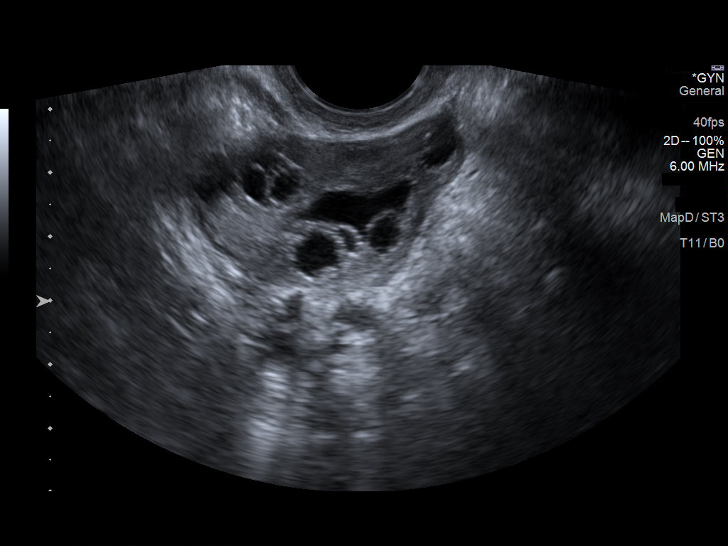
[im 29/44]
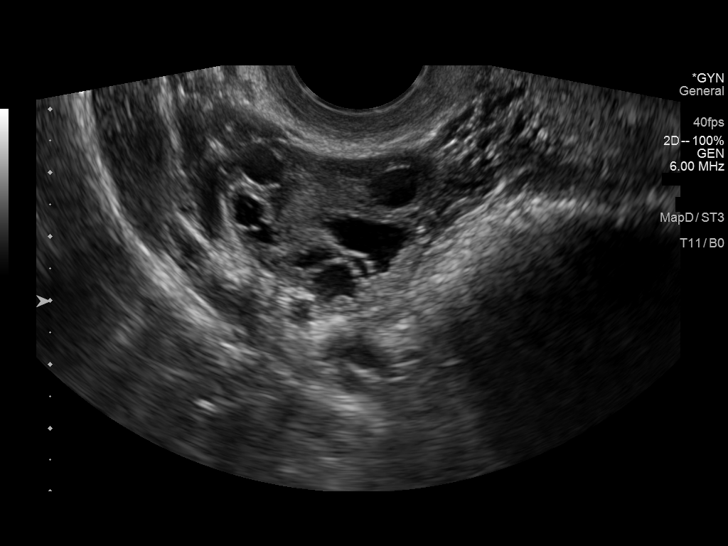
[im 33/44]
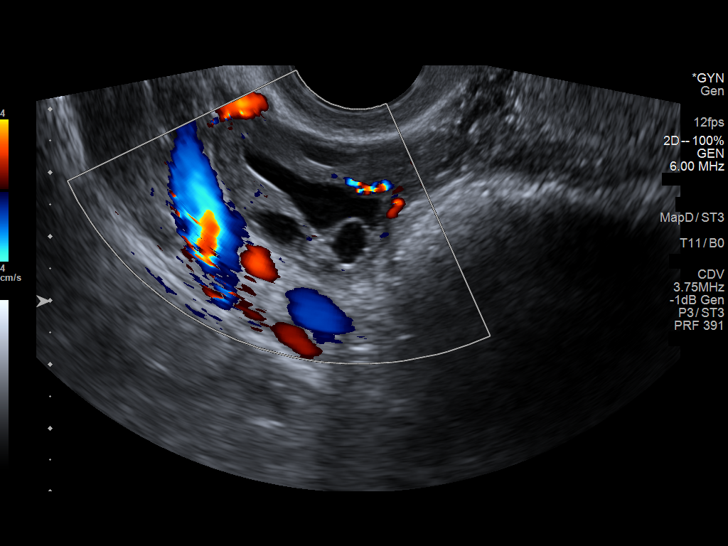
[im 36/44]
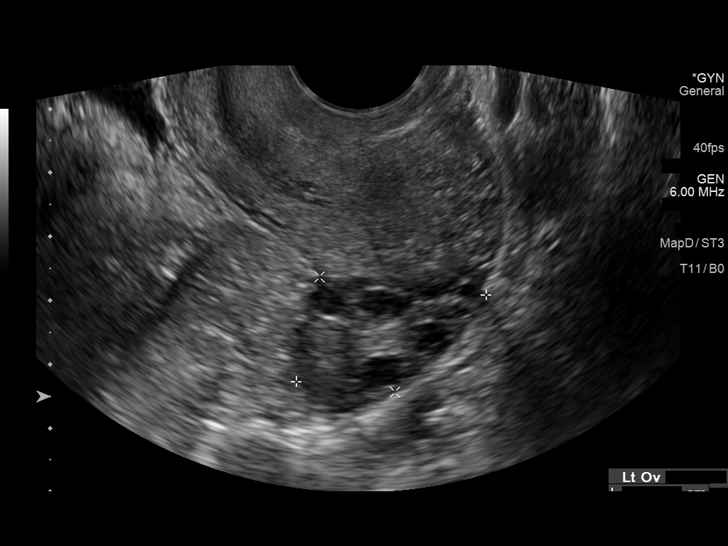
[im 40/44]
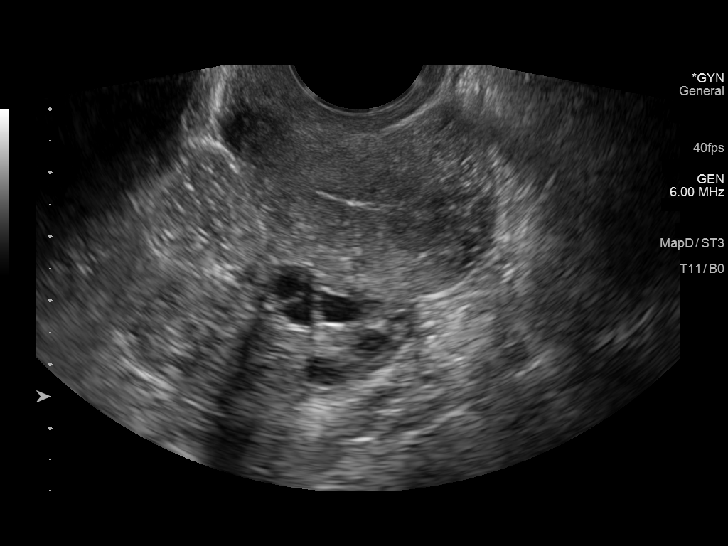
[im 44/44]
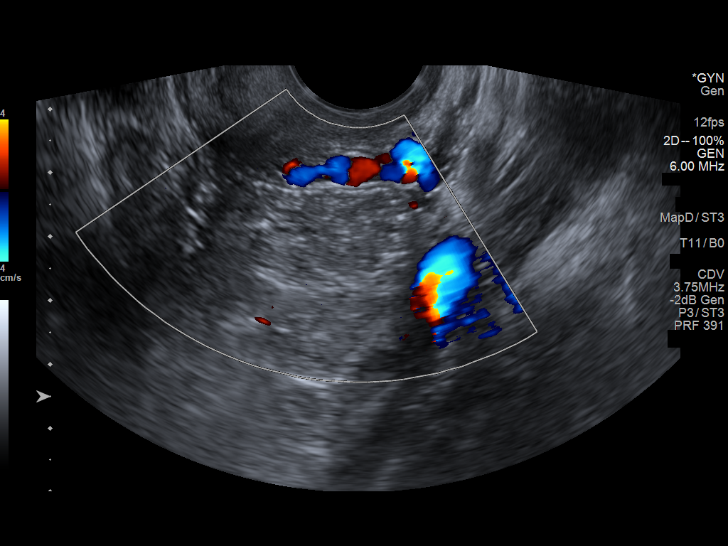

[14 of 25 positions shown; findings below may reference images not displayed]

FINDINGS: Uterus

Measurements: 6.7 x 2.9 x 4.3 cm = volume: 43 mL. Retroverted.
Normal morphology without mass

Endometrium

Thickness: 3 mm.  No endometrial fluid or focal abnormality

Right ovary

Measurements: 3.6 x 2.4 x 4.0 cm = volume: 18.0 mL. Multiple
follicles without mass

Left ovary

Measurements: 3.3 x 2.2 x 2.5 cm = volume: 9.2 mL. Multiple
follicles without mass

Other findings

Trace free pelvic fluid, likely physiologic.  No adnexal masses.
IMPRESSION: Normal exam.

## 2021-05-26 ENCOUNTER — Other Ambulatory Visit: Payer: Self-pay

## 2021-05-26 ENCOUNTER — Ambulatory Visit (HOSPITAL_COMMUNITY)
Admission: RE | Admit: 2021-05-26 | Discharge: 2021-05-26 | Disposition: A | Discharge status: Home / Self Care | Payer: Commercial Managed Care - PPO | Source: Ambulatory Visit | Attending: Family Medicine | Admitting: Family Medicine

## 2021-05-26 ENCOUNTER — Encounter (HOSPITAL_COMMUNITY): Payer: Self-pay

## 2021-05-26 VITALS — BP 106/67 | HR 106 | Temp 98.7°F | Resp 18

## 2021-05-26 DIAGNOSIS — N3090 Cystitis, unspecified without hematuria: Secondary | ICD-10-CM | POA: Diagnosis present

## 2021-05-26 DIAGNOSIS — N898 Other specified noninflammatory disorders of vagina: Secondary | ICD-10-CM | POA: Diagnosis present

## 2021-05-26 DIAGNOSIS — R350 Frequency of micturition: Secondary | ICD-10-CM | POA: Insufficient documentation

## 2021-05-26 LAB — POCT URINALYSIS DIPSTICK, ED / UC
Bilirubin Urine: NEGATIVE
Glucose, UA: NEGATIVE mg/dL
Hgb urine dipstick: NEGATIVE
Nitrite: NEGATIVE
Protein, ur: NEGATIVE mg/dL
Specific Gravity, Urine: 1.025 (ref 1.005–1.030)
Urobilinogen, UA: 0.2 mg/dL (ref 0.0–1.0)
pH: 7 (ref 5.0–8.0)

## 2021-05-26 LAB — POC URINE PREG, ED: Preg Test, Ur: NEGATIVE

## 2021-05-26 MED ORDER — CEPHALEXIN 500 MG PO CAPS: 500.0000 mg | ORAL_CAPSULE | Freq: Two times a day (BID) | ORAL | 0 refills | Status: AC

## 2021-05-26 NOTE — ED Provider Notes (Signed)
°  [place]    ASSESSMENT & PLAN:  1. Urinary frequency   2. Cystitis   3. Vaginal discharge    Begin: Meds ordered this encounter  Medications   cephALEXin (KEFLEX) 500 MG capsule    Sig: Take 1 capsule (500 mg total) by mouth 2 (two) times daily.    Dispense:  10 capsule    Refill:  0   Labs Reviewed  POCT URINALYSIS DIPSTICK, ED / UC - Abnormal; Notable for the following components:      Result Value   Ketones, ur TRACE (*)    Leukocytes,Ua TRACE (*)    All other components within normal limits  URINE CULTURE  POC URINE PREG, ED  CERVICOVAGINAL ANCILLARY ONLY   Urine culture and vaginal cytology pending.  No signs of pyelonephritis. Will follow up with her PCP or here if not showing improvement over the next 48 hours, sooner if needed.  Outlined signs and symptoms indicating need for more acute intervention. Patient verbalized understanding. After Visit Summary given.  SUBJECTIVE:  Frances Ayala is a 26 y.o. female who complains of urinary frequency, urgency and mild dysuria for the past few days; abdominal "pressure", lower abdomen. Without associated flank pain, fever, chills. Slight vaginal discharge. Gross hematuria: not present. No specific aggravating or alleviating factors reported. No LE edema. Normal PO intake without n/v/d. Without specific abdominal pain. Ambulatory without difficulty. OTC treatment: none.  LMP: No LMP recorded. Patient has had an implant.   OBJECTIVE:  Vitals:   05/26/21 1430  BP: 106/67  Pulse: (!) 106  Resp: 18  Temp: 98.7 F (37.1 C)  SpO2: 97%   Recheck P: 98 General appearance: alert; no distress Lungs: unlabored respirations Abdomen: soft, benign Back: no CVA tenderness GU: deferred Extremities: no edema; symmetrical with no gross deformities Skin: warm and dry Neurologic: normal gait Psychological: alert and cooperative; normal mood and affect  Labs Reviewed  POCT URINALYSIS DIPSTICK, ED / UC -  Abnormal; Notable for the following components:      Result Value   Ketones, ur TRACE (*)    Leukocytes,Ua TRACE (*)    All other components within normal limits  URINE CULTURE  POC URINE PREG, ED  CERVICOVAGINAL ANCILLARY ONLY    No Known Allergies  Past Medical History:  Diagnosis Date   Seasonal allergies    Social History   Socioeconomic History   Marital status: Single    Spouse name: Not on file   Number of children: Not on file   Years of education: Not on file   Highest education level: Not on file  Occupational History   Not on file  Tobacco Use   Smoking status: Never   Smokeless tobacco: Never  Vaping Use   Vaping Use: Never used  Substance and Sexual Activity   Alcohol use: Yes    Comment: occ   Drug use: No   Sexual activity: Not on file  Other Topics Concern   Not on file  Social History Narrative   Not on file   Social Determinants of Health   Financial Resource Strain: Not on file  Food Insecurity: Not on file  Transportation Needs: Not on file  Physical Activity: Not on file  Stress: Not on file  Social Connections: Not on file  Intimate Partner Violence: Not on file   History reviewed. No pertinent family history.      Vanessa Kick, MD 05/26/21 1620

## 2021-05-26 NOTE — ED Triage Notes (Signed)
Pt reports diarrhea and Pressure when urinating . Pt also has an odor.

## 2021-05-26 NOTE — Discharge Instructions (Signed)
You have had labs (urine culture and STD testing) sent today. We will call you with any significant abnormalities or if there is need to begin or change treatment or pursue further follow up.  You may also review your test results online through MyChart. If you do not have a MyChart account, instructions to sign up should be on your discharge paperwork.

## 2021-05-27 LAB — URINE CULTURE: Culture: <10000 — AB

## 2021-05-28 LAB — CERVICOVAGINAL ANCILLARY ONLY
Bacterial Vaginitis (gardnerella): POSITIVE — AB
Candida Glabrata: NEGATIVE
Candida Vaginitis: NEGATIVE
Chlamydia: NEGATIVE
Comment: NEGATIVE
Comment: NEGATIVE
Comment: NEGATIVE
Comment: NEGATIVE
Comment: NEGATIVE
Comment: NORMAL
Neisseria Gonorrhea: NEGATIVE
Trichomonas: NEGATIVE

## 2021-05-29 ENCOUNTER — Telehealth (HOSPITAL_COMMUNITY): Payer: Self-pay | Admitting: Emergency Medicine

## 2021-05-29 MED ORDER — METRONIDAZOLE 500 MG PO TABS: 500.0000 mg | ORAL_TABLET | Freq: Two times a day (BID) | ORAL | 0 refills | Status: DC

## 2021-05-30 ENCOUNTER — Telehealth (HOSPITAL_COMMUNITY): Payer: Self-pay | Admitting: Emergency Medicine

## 2021-05-30 MED ORDER — METRONIDAZOLE 500 MG PO TABS: 500.0000 mg | ORAL_TABLET | Freq: Two times a day (BID) | ORAL | 0 refills | Status: AC

## 2021-05-30 NOTE — Telephone Encounter (Signed)
Patient needed prescription sent to a different pharmacy, resent ?

## 2021-09-22 IMAGING — DX DG CHEST 1V PORT
1 series · 1 of 1 positions shown · non-contrast
Comparison: Prior radiograph from 01/11/2020.

CLINICAL DATA: Initial evaluation for acute shortness of breath,
sore throat.

EXAM:
PORTABLE CHEST 1 VIEW

[chest]
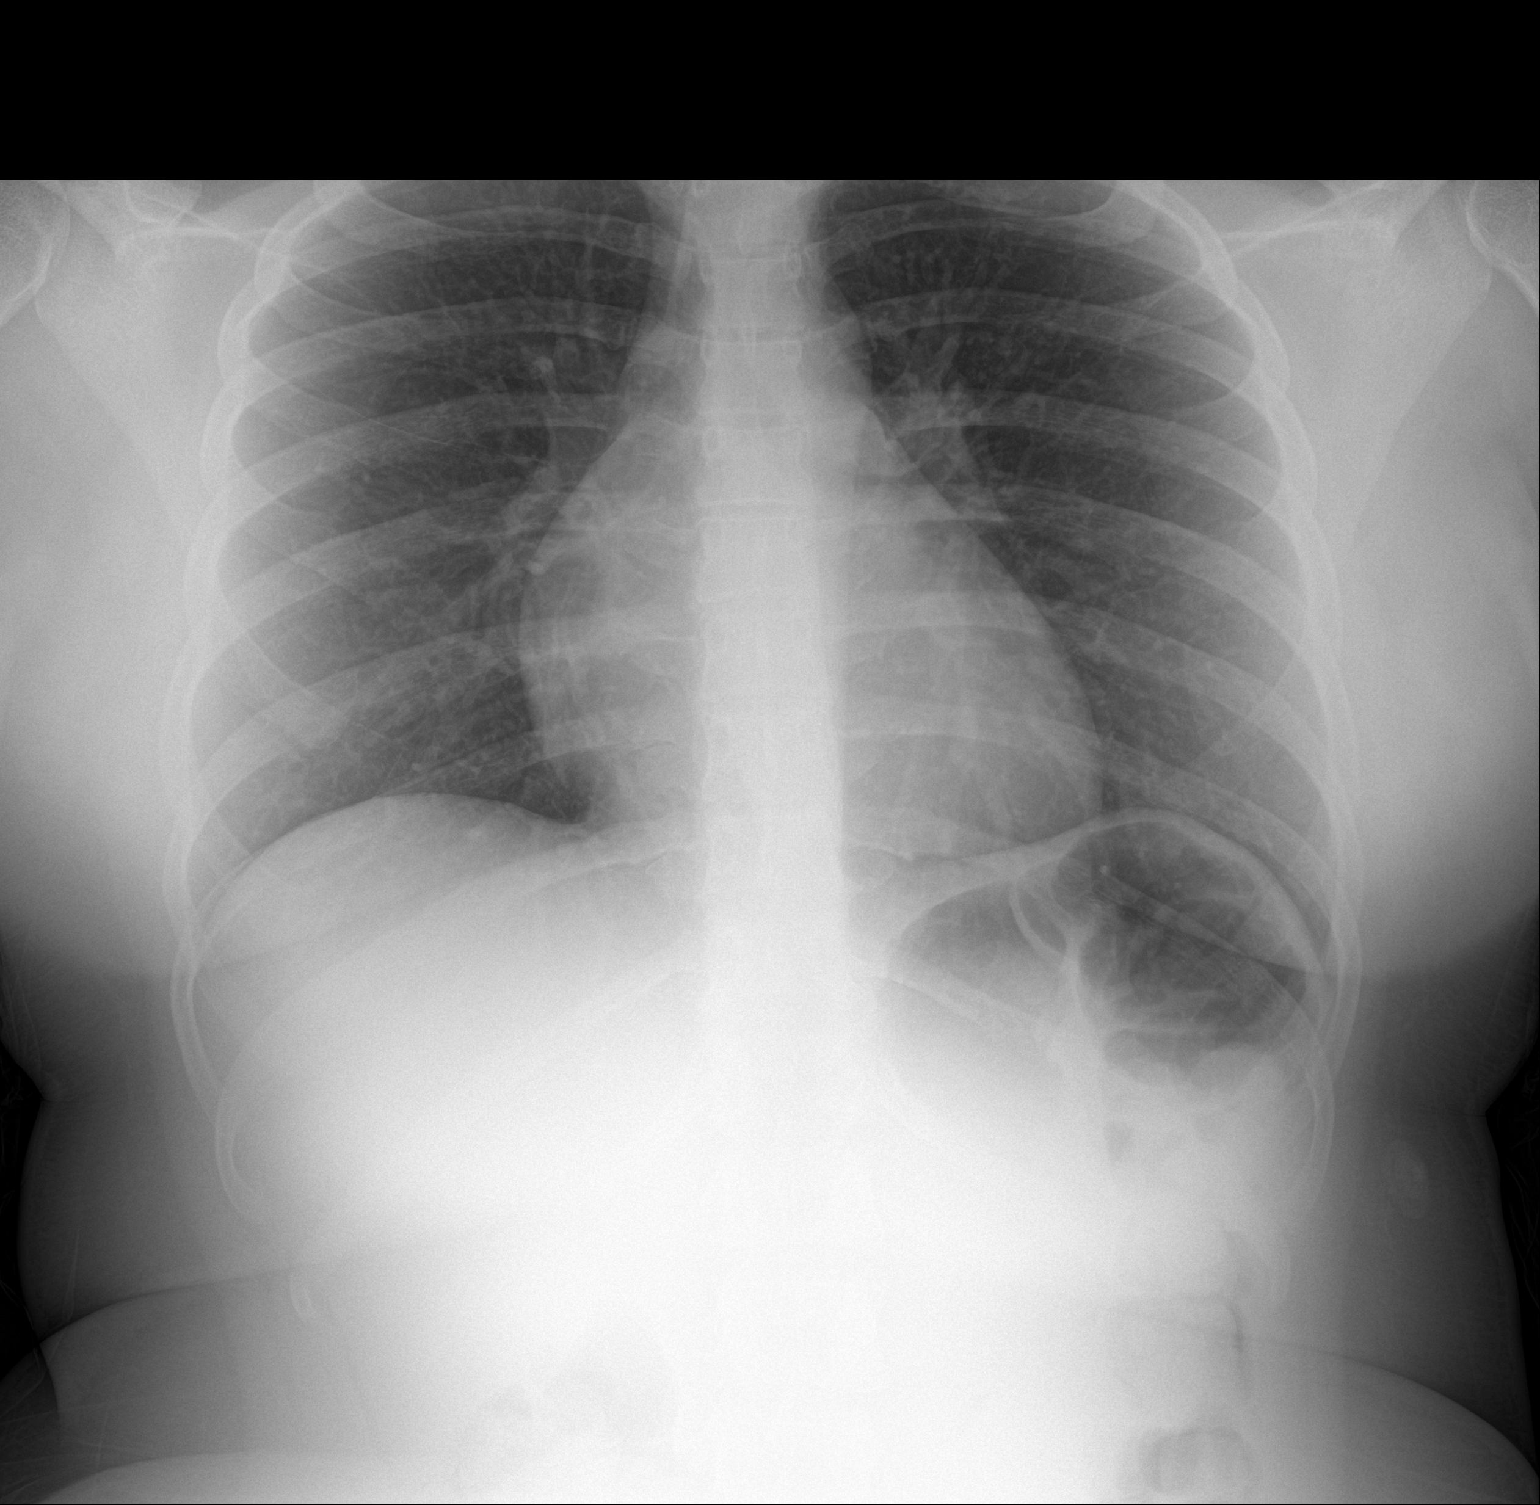

[1 of 1 positions shown; findings below may reference images not displayed]

FINDINGS: The cardiac and mediastinal silhouettes are stable in size and
contour, and remain within normal limits.

The lungs are normally inflated. No airspace consolidation, pleural
effusion, or pulmonary edema. No pneumothorax.

No acute osseous abnormality.
IMPRESSION: No radiographic evidence for active cardiopulmonary disease.

## 2022-08-24 ENCOUNTER — Ambulatory Visit (HOSPITAL_COMMUNITY): Payer: Self-pay

## 2024-02-28 ENCOUNTER — Encounter (HOSPITAL_BASED_OUTPATIENT_CLINIC_OR_DEPARTMENT_OTHER): Payer: Self-pay

## 2024-02-28 ENCOUNTER — Other Ambulatory Visit: Payer: Self-pay

## 2024-02-28 ENCOUNTER — Emergency Department (HOSPITAL_BASED_OUTPATIENT_CLINIC_OR_DEPARTMENT_OTHER)
Admission: EM | Admit: 2024-02-28 | Discharge: 2024-02-28 | Disposition: A | Discharge status: Home / Self Care | Attending: Emergency Medicine | Admitting: Emergency Medicine

## 2024-02-28 ENCOUNTER — Emergency Department (HOSPITAL_BASED_OUTPATIENT_CLINIC_OR_DEPARTMENT_OTHER)

## 2024-02-28 DIAGNOSIS — Z3A00 Weeks of gestation of pregnancy not specified: Secondary | ICD-10-CM | POA: Insufficient documentation

## 2024-02-28 DIAGNOSIS — O26899 Other specified pregnancy related conditions, unspecified trimester: Secondary | ICD-10-CM | POA: Insufficient documentation

## 2024-02-28 DIAGNOSIS — R519 Headache, unspecified: Secondary | ICD-10-CM | POA: Diagnosis not present

## 2024-02-28 DIAGNOSIS — O99280 Endocrine, nutritional and metabolic diseases complicating pregnancy, unspecified trimester: Secondary | ICD-10-CM | POA: Diagnosis not present

## 2024-02-28 DIAGNOSIS — R000 Tachycardia, unspecified: Secondary | ICD-10-CM | POA: Insufficient documentation

## 2024-02-28 DIAGNOSIS — R0789 Other chest pain: Secondary | ICD-10-CM | POA: Diagnosis not present

## 2024-02-28 DIAGNOSIS — E876 Hypokalemia: Secondary | ICD-10-CM | POA: Insufficient documentation

## 2024-02-28 DIAGNOSIS — Z3A01 Less than 8 weeks gestation of pregnancy: Secondary | ICD-10-CM

## 2024-02-28 LAB — CBC WITH DIFFERENTIAL/PLATELET
Abs Immature Granulocytes: 0.01 K/uL (ref 0.00–0.07)
Basophils Absolute: 0 K/uL (ref 0.0–0.1)
Basophils Relative: 1 %
Eosinophils Absolute: 0 K/uL (ref 0.0–0.5)
Eosinophils Relative: 0 %
HCT: 36.4 % (ref 36.0–46.0)
Hemoglobin: 12.1 g/dL (ref 12.0–15.0)
Immature Granulocytes: 0 %
Lymphocytes Relative: 52 %
Lymphs Abs: 3.7 K/uL (ref 0.7–4.0)
MCH: 28.8 pg (ref 26.0–34.0)
MCHC: 33.2 g/dL (ref 30.0–36.0)
MCV: 86.7 fL (ref 80.0–100.0)
Monocytes Absolute: 0.5 K/uL (ref 0.1–1.0)
Monocytes Relative: 7 %
Neutro Abs: 2.8 K/uL (ref 1.7–7.7)
Neutrophils Relative %: 40 %
Platelets: 316 K/uL (ref 150–400)
RBC: 4.2 MIL/uL (ref 3.87–5.11)
RDW: 13.2 % (ref 11.5–15.5)
WBC: 7 K/uL (ref 4.0–10.5)
nRBC: 0 % (ref 0.0–0.2)

## 2024-02-28 LAB — PREGNANCY, URINE: Preg Test, Ur: POSITIVE — AB

## 2024-02-28 LAB — RESP PANEL BY RT-PCR (RSV, FLU A&B, COVID)  RVPGX2
Influenza A by PCR: NEGATIVE
Influenza B by PCR: NEGATIVE
Resp Syncytial Virus by PCR: NEGATIVE
SARS Coronavirus 2 by RT PCR: NEGATIVE

## 2024-02-28 LAB — BASIC METABOLIC PANEL WITH GFR
Anion gap: 12 (ref 5–15)
BUN: 10 mg/dL (ref 6–20)
CO2: 23 mmol/L (ref 22–32)
Calcium: 10.1 mg/dL (ref 8.9–10.3)
Chloride: 102 mmol/L (ref 98–111)
Creatinine, Ser: 0.58 mg/dL (ref 0.44–1.00)
GFR, Estimated: >60 mL/min (ref >60)
Glucose, Bld: 90 mg/dL (ref 70–99)
Potassium: 3.2 mmol/L — ABNORMAL LOW (ref 3.5–5.1)
Sodium: 137 mmol/L (ref 135–145)

## 2024-02-28 LAB — HCG, QUANTITATIVE, PREGNANCY: hCG, Beta Chain, Quant, S: 2085 m[IU]/mL — ABNORMAL HIGH (ref <5)

## 2024-02-28 LAB — TROPONIN T, HIGH SENSITIVITY: Troponin T High Sensitivity: <15 ng/L (ref 0–19)

## 2024-02-28 MED ORDER — ACETAMINOPHEN 500 MG PO TABS
1000.0000 mg | ORAL_TABLET | Freq: Once | ORAL | Status: AC
  Administered 2024-02-28: 1000 mg via ORAL
  Filled 2024-02-28: qty 2

## 2024-02-28 MED ORDER — HYDROXYZINE HCL 25 MG PO TABS
25.0000 mg | ORAL_TABLET | Freq: Once | ORAL | Status: AC
  Administered 2024-02-28: 25 mg via ORAL
  Filled 2024-02-28: qty 1

## 2024-02-28 MED ORDER — DIPHENHYDRAMINE HCL 50 MG/ML IJ SOLN
25.0000 mg | Freq: Once | INTRAMUSCULAR | Status: AC
  Administered 2024-02-28: 25 mg via INTRAVENOUS
  Filled 2024-02-28: qty 1

## 2024-02-28 MED ORDER — LACTATED RINGERS IV BOLUS
1000.0000 mL | Freq: Once | INTRAVENOUS | Status: AC
  Administered 2024-02-28: 1000 mL via INTRAVENOUS

## 2024-02-28 NOTE — ED Provider Notes (Signed)
  Physical Exam  BP 137/70   Pulse (!) 126   Temp 97.6 F (36.4 C) (Temporal)   Resp (!) 21   Ht 5' 2 (1.575 m)   Wt 90.7 kg   SpO2 100%   BMI 36.58 kg/m   Physical Exam  Procedures  Procedures  ED Course / MDM    Medical Decision Making Amount and/or Complexity of Data Reviewed Labs: ordered. Radiology: ordered.  Risk OTC drugs. Prescription drug management.   Patient was received from  Franaszek, PA-C in handoff.  Please see her note for full HPI.  Briefly, patient reports emergency department for complaints of chest tightness and intermittent flushing yesterday and today.  She heart rate upon arrival was in the 110s.  Patient was unaware she was pregnant and when told by the provider, her heart rate went up to 130.  Patient was experiencing symptoms of anxiety so she was given Benadryl , hydroxyzine  and IV fluids.  After being given these medications, patient went to ultrasound to confirm an IUP.  IUP was confirmed at this stage as well as trending quantitative hCG.  I spoke to the patient and informed her of these results.  I did recommend that she schedule an appointment with her OB/GYN in the next week to establish prenatal care as well as trend her beta-hCG.  Patient verbalized her understanding to this.  I have also provided patient with some breathing resources to assist with her symptoms of anxiety.  Patient's vital signs are stable.  Her pulse prior to discharge is 98.         Frances Ayala 02/28/24 2135    Armenta Canning, MD 02/29/24 [phone]

## 2024-02-28 NOTE — ED Provider Notes (Signed)
 [place] EMERGENCY DEPARTMENT AT [facility] Provider Note   CSN: [id number] Arrival date & time: 02/28/24  1528     Patient presents with: Panic Attack   Frances Ayala is a 28 y.o. female with no significant past medical history presents with concern for a possible panic attack yesterday and today.  She reports that this started when she got out of the shower and she felt very hot and flushed.  Intermittently throughout yesterday and today, she has felt some chest pressure and felt shaky.  She also reports a frontal headache.  Denies any chest pain with exertion, no radiation of chest pain to the back.  She denies any pleuritic chest pain.  Denies any cough, fever, or chills.  Denies abdominal pain, abnormal vaginal discharge, or vaginal bleeding.  Reports her last menstrual period was at the end of October 2025.  She denies being on any birth control.  Denies any recent long plane or car rides, recent hospitalizations or surgeries.  Denies any history of blood clot.  Denies any leg pain or swelling.   HPI     Prior to Admission medications   Medication Sig Start Date End Date Taking? Authorizing Provider  cephALEXin  (KEFLEX ) 500 MG capsule Take 1 capsule (500 mg total) by mouth 2 (two) times daily. 05/26/21   Rolinda Rogue, MD  cetirizine (ZYRTEC) 10 MG tablet Take 10 mg by mouth daily.    [provider]  ibuprofen  (ADVIL ) 800 MG tablet Take 1 tablet (800 mg total) by mouth 3 (three) times daily. 01/11/20   Wieters, Hallie C, PA-C  metroNIDAZOLE  (FLAGYL ) 500 MG tablet Take 1 tablet (500 mg total) by mouth 2 (two) times daily. 05/30/21   Blaise Aleene KIDD, MD  spironolactone (ALDACTONE) 25 MG tablet Take 25 mg by mouth daily.    [provider]  ipratropium (ATROVENT ) 0.06 % nasal spray Place 2 sprays into both nostrils 4 (four) times daily. 02/25/18 06/02/19  Babara Greig GAILS, PA-C    Allergies: Patient has no known allergies.    Review of Systems   Constitutional:  Negative for fever.  Respiratory:  Positive for chest tightness.   Cardiovascular:  Positive for chest pain.    Updated Vital Signs BP 137/70   Pulse (!) 126   Temp 97.6 F (36.4 C) (Temporal)   Resp (!) 21   Ht 5' 2 (1.575 m)   Wt 90.7 kg   SpO2 100%   BMI 36.58 kg/m   Physical Exam Vitals and nursing note reviewed.  Constitutional:      General: She is not in acute distress.    Appearance: She is well-developed.     Comments: Very nervous appearing, shaking  HENT:     Head: Normocephalic and atraumatic.  Eyes:     Conjunctiva/sclera: Conjunctivae normal.  Cardiovascular:     Rate and Rhythm: Regular rhythm. Tachycardia present.     Heart sounds: No murmur heard. Pulmonary:     Effort: Pulmonary effort is normal. No respiratory distress.     Breath sounds: Normal breath sounds.  Abdominal:     Palpations: Abdomen is soft.     Tenderness: There is no abdominal tenderness.  Musculoskeletal:        General: No swelling.     Cervical back: Neck supple.     Right lower leg: No edema.     Left lower leg: No edema.     Comments: No calf tenderness to palpation bilaterally  Skin:  General: Skin is warm and dry.     Capillary Refill: Capillary refill takes less than 2 seconds.  Neurological:     Mental Status: She is alert.  Psychiatric:        Mood and Affect: Mood normal.     (all labs ordered are listed, but only abnormal results are displayed) Labs Reviewed  BASIC METABOLIC PANEL WITH GFR - Abnormal; Notable for the following components:      Result Value   Potassium 3.2 (*)    All other components within normal limits  PREGNANCY, URINE - Abnormal; Notable for the following components:   Preg Test, Ur POSITIVE (*)    All other components within normal limits  HCG, QUANTITATIVE, PREGNANCY - Abnormal; Notable for the following components:   hCG, Beta Chain, Quant, S 2,085 (*)    All other components within normal limits  RESP PANEL BY  RT-PCR (RSV, FLU A&B, COVID)  RVPGX2  CBC WITH DIFFERENTIAL/PLATELET  TROPONIN T, HIGH SENSITIVITY    EKG: EKG Interpretation Date/Time:  Saturday February 28 2024 17:23:53 EST Ventricular Rate:  132 PR Interval:  160 QRS Duration:  78 QT Interval:  305 QTC Calculation: 452 R Axis:   32  Text Interpretation: Sinus tachycardia increased rate relative to previous, otherwise similar Confirmed by Armenta Canning [id number]) on 02/28/2024 6:12:49 PM  Radiology: No results found.   Procedures   Medications Ordered in the ED  hydrOXYzine (ATARAX) tablet 25 mg (25 mg Oral Given 02/28/24 1709)  acetaminophen  (TYLENOL ) tablet 1,000 mg (1,000 mg Oral Given 02/28/24 1709)  lactated ringers bolus 1,000 mL (1,000 mLs Intravenous New Bag/Given 02/28/24 1740)  diphenhydrAMINE  (BENADRYL ) injection 25 mg (25 mg Intravenous Given 02/28/24 1740)                HEART Score: 0                    Medical Decision Making Amount and/or Complexity of Data Reviewed Labs: ordered. Radiology: ordered.  Risk OTC drugs. Prescription drug management.     Differential diagnosis includes but is not limited to anxiety, electrolyte abnormality, ACS, arrhythmia, aortic aneurysm, pericarditis, myocarditis, pericardial effusion, cardiac tamponade, musculoskeletal pain, GERD, Boerhaave's syndrome, DVT/PE, pneumonia, pleural effusion   ED Course:  Upon initial evaluation, patient is very nervous appearing, shaking.  Normal vitals on initial evaluation.  No murmurs heard on cardiac auscultation.  Lungs good auscultation bilaterally.  Abdomen is soft and nontender.   Labs Ordered: I Ordered, and personally interpreted labs.  The pertinent results include:   CBC within normal limits BMP with hypokalemia at 3.2, otherwise unremarkable Troponin within normal limits COVID, flu, RSV negative Pregnancy test positive, beta-hCG elevated at 2085  Imaging Studies ordered: I ordered imaging studies including  transvaginal ultrasound, pending results   Cardiac Monitoring: / EKG: The patient was maintained on a cardiac monitor.  I personally viewed and interpreted the cardiac monitored which showed an underlying rhythm of: Sinus tachycardia   Medications Given: LR bolus Hydroxyzine Benadryl  Tylenol   Upon re-evaluation, patient remains well-appearing.   Low concern for ACS at this time given troponin remains within normal limits, pain non-exertional, and EKG with normal sinus rhythm and no ST changes. HEART score of 0.  Lower concern for infectious etiology such as COVID, flu, pneumonia given no cough, fevers.  Lower concern for pulmonary embolism at this time given patient was PERC negative upon arrival.  Upon being told she was pregnant, her heart rate increased to  be tachycardic.  This tachycardia seems secondary to her anxiety and was not present upon arrival.  Will hold on D-dimer or VQ scan at this time, but my attending Dr. Ismael was also going to evaluate patient and provide further recommendations    Impression: Atypical chest pain Anxiety Pregnancy  Disposition:  Care of this patient signed out to oncoming ED provider Oscar Kidney, PA-C to follow-up on pelvic ultrasound results and recommendations of Dr. Armenta. Disposition and treatment plan pending imaging results and clinical judgment of oncoming ED team.      This chart was dictated using voice recognition software, Dragon. Despite the best efforts of this provider to proofread and correct errors, errors may still occur which can change documentation meaning.       Final diagnoses:  Less than [redacted] weeks gestation of pregnancy  Chest tightness    ED Discharge Orders     None          Frances Ayala, Frances Ayala 02/28/24 1904    Armenta Canning, MD 02/29/24 [phone]

## 2024-02-28 NOTE — Discharge Instructions (Signed)
 It was a pleasure taking care of you today. You were seen in the Emergency Department for evaluation of chest tightness and possible anxiety. Your work-up was reassuring. Your urine showed a positive pregnancy test.  We then obtained an ultrasound, which confirmed an intrauterine pregnancy.  Please schedule an appointment with your OB/GYN to obtain prenatal care.  Please also ask them to obtain what is called a beta hCG test, to monitor your pregnancy at this early stage.  In addition, I recommend utilizing stress relieving techniques such as deep breathing to assist with your chest tightness and anxiety.  Refer to the attached documentation for further management of your symptoms.  You may discuss your symptoms of chest tightness and anxiety with your OB/GYN as they may have a suggestion for how to best manage this during pregnancy.  Please return to the ER if you experience chest pain, trouble breathing, intractable nausea/vomiting or any other life threatening illnesses.

## 2024-02-28 NOTE — ED Triage Notes (Signed)
 Pt POV reporting panic attack yesterday and persistent increased anxiety today, reports feeling shaking and having a headache.

## 2024-02-28 NOTE — ED Notes (Signed)
 Pt d/c instructions, medications, and follow-up care reviewed with pt. Pt verbalized understanding and had no further questions at time of d/c. Pt CA&Ox4, ambulatory, and in NAD at time of d/c
